# Patient Record
Sex: Female | Born: 1995 | Race: Black or African American | Hispanic: No | Marital: Single | Smoking: Current every day smoker
Health system: Southern US, Community
[De-identification: ages and names within clinical notes are randomized; demographics above are authoritative.]

## PROBLEM LIST (undated history)

## (undated) HISTORY — PX: TONSILLECTOMY: SUR1361

---

## 2013-04-13 ENCOUNTER — Emergency Department (HOSPITAL_BASED_OUTPATIENT_CLINIC_OR_DEPARTMENT_OTHER): Payer: Self-pay

## 2013-04-13 ENCOUNTER — Encounter (HOSPITAL_BASED_OUTPATIENT_CLINIC_OR_DEPARTMENT_OTHER): Payer: Self-pay | Admitting: Emergency Medicine

## 2013-04-13 ENCOUNTER — Emergency Department (HOSPITAL_BASED_OUTPATIENT_CLINIC_OR_DEPARTMENT_OTHER)
Admission: EM | Admit: 2013-04-13 | Discharge: 2013-04-13 | Disposition: A | Discharge status: Home / Self Care | Payer: Self-pay | Attending: Emergency Medicine | Admitting: Emergency Medicine

## 2013-04-13 DIAGNOSIS — F17200 Nicotine dependence, unspecified, uncomplicated: Secondary | ICD-10-CM | POA: Insufficient documentation

## 2013-04-13 DIAGNOSIS — R110 Nausea: Secondary | ICD-10-CM | POA: Insufficient documentation

## 2013-04-13 DIAGNOSIS — R1031 Right lower quadrant pain: Secondary | ICD-10-CM | POA: Insufficient documentation

## 2013-04-13 DIAGNOSIS — R109 Unspecified abdominal pain: Secondary | ICD-10-CM

## 2013-04-13 DIAGNOSIS — R1011 Right upper quadrant pain: Secondary | ICD-10-CM | POA: Insufficient documentation

## 2013-04-13 DIAGNOSIS — R1033 Periumbilical pain: Secondary | ICD-10-CM | POA: Insufficient documentation

## 2013-04-13 DIAGNOSIS — Z3202 Encounter for pregnancy test, result negative: Secondary | ICD-10-CM | POA: Insufficient documentation

## 2013-04-13 LAB — COMPREHENSIVE METABOLIC PANEL
ALK PHOS: 59 U/L (ref 39–117)
ALT: 9 U/L (ref 0–35)
AST: 13 U/L (ref 0–37)
Albumin: 4.2 g/dL (ref 3.5–5.2)
BUN: 9 mg/dL (ref 6–23)
CO2: 25 mEq/L (ref 19–32)
Calcium: 9.9 mg/dL (ref 8.4–10.5)
Chloride: 98 mEq/L (ref 96–112)
Creatinine, Ser: 0.7 mg/dL (ref 0.50–1.10)
GFR calc Af Amer: >90 mL/min (ref >90)
GFR calc non Af Amer: >90 mL/min (ref >90)
Glucose, Bld: 100 mg/dL — ABNORMAL HIGH (ref 70–99)
POTASSIUM: 4.3 meq/L (ref 3.7–5.3)
SODIUM: 138 meq/L (ref 137–147)
TOTAL PROTEIN: 7.7 g/dL (ref 6.0–8.3)
Total Bilirubin: 0.6 mg/dL (ref 0.3–1.2)

## 2013-04-13 LAB — URINALYSIS, ROUTINE W REFLEX MICROSCOPIC
Bilirubin Urine: NEGATIVE
Glucose, UA: NEGATIVE mg/dL
Hgb urine dipstick: NEGATIVE
Ketones, ur: 15 mg/dL — AB
LEUKOCYTES UA: NEGATIVE
Nitrite: NEGATIVE
PROTEIN: NEGATIVE mg/dL
Specific Gravity, Urine: 1.029 (ref 1.005–1.030)
UROBILINOGEN UA: 0.2 mg/dL (ref 0.0–1.0)
pH: 6 (ref 5.0–8.0)

## 2013-04-13 LAB — LIPASE, BLOOD: Lipase: 14 U/L (ref 11–59)

## 2013-04-13 LAB — CBC
HCT: 40.5 % (ref 36.0–46.0)
Hemoglobin: 13.9 g/dL (ref 12.0–15.0)
MCH: 31 pg (ref 26.0–34.0)
MCHC: 34.3 g/dL (ref 30.0–36.0)
MCV: 90.4 fL (ref 78.0–100.0)
Platelets: 395 10*3/uL (ref 150–400)
RBC: 4.48 MIL/uL (ref 3.87–5.11)
RDW: 11.7 % (ref 11.5–15.5)
WBC: 11.5 10*3/uL — ABNORMAL HIGH (ref 4.0–10.5)

## 2013-04-13 LAB — WET PREP, GENITAL
Clue Cells Wet Prep HPF POC: NONE SEEN
Trich, Wet Prep: NONE SEEN
WBC, Wet Prep HPF POC: NONE SEEN
Yeast Wet Prep HPF POC: NONE SEEN

## 2013-04-13 LAB — PREGNANCY, URINE: PREG TEST UR: NEGATIVE

## 2013-04-13 MED ORDER — MORPHINE SULFATE 4 MG/ML IJ SOLN
6.0000 mg | Freq: Once | INTRAMUSCULAR | Status: AC
  Administered 2013-04-13: 6 mg via INTRAVENOUS
  Filled 2013-04-13: qty 2

## 2013-04-13 MED ORDER — HYDROCODONE-ACETAMINOPHEN 5-325 MG PO TABS: 1.0000 | ORAL_TABLET | Freq: Four times a day (QID) | ORAL | Status: AC | PRN

## 2013-04-13 MED ORDER — IOHEXOL 300 MG/ML  SOLN
50.0000 mL | Freq: Once | INTRAMUSCULAR | Status: AC | PRN
  Administered 2013-04-13: 50 mL via ORAL

## 2013-04-13 MED ORDER — ONDANSETRON HCL 4 MG/2ML IJ SOLN
4.0000 mg | Freq: Once | INTRAMUSCULAR | Status: AC
  Administered 2013-04-13: 4 mg via INTRAVENOUS
  Filled 2013-04-13: qty 2

## 2013-04-13 MED ORDER — IOHEXOL 300 MG/ML  SOLN
100.0000 mL | Freq: Once | INTRAMUSCULAR | Status: AC | PRN
  Administered 2013-04-13: 100 mL via INTRAVENOUS

## 2013-04-13 NOTE — ED Notes (Signed)
Pt c/o diffuse abd pain with nausea x 3 days

## 2013-04-13 NOTE — ED Provider Notes (Signed)
CSN: [id number]     Arrival date & time 04/13/13  1840 History  This chart was scribed for Morgan HaitWilliam Troye Hiemstra, MD by Quintella ReichertMatthew Underwood, ED scribe.  This patient was seen in room MH07/MH07 and the patient's care was started at 7:52 PM.   Chief Complaint  Patient presents with  . Abdominal Pain    Patient is a 18 y.o. female presenting with abdominal pain. The history is provided by the patient. No language interpreter was used.  Abdominal Pain Pain quality: dull and throbbing   Pain severity:  Severe Duration:  2 days Timing:  Constant Chronicity:  New Context: not previous surgeries and not trauma   Relieved by:  Nothing Worsened by:  Movement Ineffective treatments:  OTC medications Associated symptoms: nausea   Associated symptoms: no chest pain, no diarrhea, no dysuria, no fever, no shortness of breath, no vaginal bleeding, no vaginal discharge and no vomiting   Risk factors: has not had multiple surgeries     HPI Comments: Morgan Barnett is a 18 y.o. female with no chronic medical conditions who presents to the Emergency Department complaining of 2 days of constant severe periumbilical abdominal pain with associated nausea.  Pt describes pain as intense, dull and throbbing.  It is localized mainly around the umbilicus but moves to the right side of her abdomen as well.  It is worsened by movement including when the car hit bumps on her ride here.  It is not relieved by anything.  She has attempted to treat pain with OTC medications, without relief.  Pt also complains of intermittent nausea but denies vomiting.  She denies fever, diarrhea, SOB, CP, vaginal bleeding or discharge, or dysuria.  She denies possibility of pregnancy.  She is a current smoker and occasionally drinks alcohol.   History reviewed. No pertinent past medical history.   Past Surgical History  Procedure Laterality Date  . Tonsillectomy      History reviewed. No pertinent family history.   History   Substance Use Topics  . Smoking status: Current Every Day Smoker -- 0.50 packs/day    Types: Cigarettes  . Smokeless tobacco: Not on file  . Alcohol Use: No    OB History   Grav Para Term Preterm Abortions TAB SAB Ect Mult Living                  Review of Systems  Constitutional: Negative for fever.  Respiratory: Negative for shortness of breath.   Cardiovascular: Negative for chest pain.  Gastrointestinal: Positive for nausea and abdominal pain. Negative for vomiting and diarrhea.  Genitourinary: Negative for dysuria, vaginal bleeding and vaginal discharge.  All other systems reviewed and are negative.     Allergies  Review of patient's allergies indicates no known allergies.  Home Medications  No current outpatient prescriptions on file.  BP 129/76  Pulse 94  Temp(Src) 98.7 F (37.1 C)  Resp 16  Ht 5\' 2"  (1.575 m)  Wt 220 lb (99.791 kg)  BMI 40.23 kg/m2  SpO2 100%  LMP 03/30/2013  Physical Exam  Nursing note and vitals reviewed. Constitutional: She is oriented to person, place, and time. She appears well-developed and well-nourished. No distress.  HENT:  Head: Normocephalic and atraumatic.  Eyes: EOM are normal.  Neck: Neck supple. No tracheal deviation present.  Cardiovascular: Normal rate.   Pulmonary/Chest: Effort normal. No respiratory distress.  Abdominal: There is tenderness (central, RUQ, and RLQ).  Genitourinary: There is no rash, tenderness or lesion on the right  labia. There is no rash, tenderness or lesion on the left labia. Right adnexum displays no mass, no tenderness and no fullness. Left adnexum displays no mass, no tenderness and no fullness.  Musculoskeletal: Normal range of motion.  Neurological: She is alert and oriented to person, place, and time.  Skin: Skin is warm and dry.  Psychiatric: She has a normal mood and affect. Her behavior is normal.    ED Course  Procedures (including critical care time)  DIAGNOSTIC STUDIES: Oxygen  Saturation is 100% on room air, normal by my interpretation.    COORDINATION OF CARE: 7:56 PM-Discussed treatment plan which includes pain medication, pelvic exam, CT abdomen, and labs with pt at bedside and pt agreed to plan.    Labs Review Labs Reviewed  URINALYSIS, ROUTINE W REFLEX MICROSCOPIC - Abnormal; Notable for the following:    Ketones, ur 15 (*)    All other components within normal limits  CBC - Abnormal; Notable for the following:    WBC 11.5 (*)    All other components within normal limits  COMPREHENSIVE METABOLIC PANEL - Abnormal; Notable for the following:    Glucose, Bld 100 (*)    All other components within normal limits  WET PREP, GENITAL  GC/CHLAMYDIA PROBE AMP  PREGNANCY, URINE  LIPASE, BLOOD    Imaging Review Ct Abdomen Pelvis W Contrast  04/13/2013   CLINICAL DATA:  Mid and lower abdominal pain and nausea.  EXAM: CT ABDOMEN AND PELVIS WITH CONTRAST  TECHNIQUE: Multidetector CT imaging of the abdomen and pelvis was performed using the standard protocol following bolus administration of intravenous contrast.  CONTRAST:  50mL OMNIPAQUE IOHEXOL 300 MG/ML SOLN, OMNIPAQUE IOHEXOL 300 MG/ML SOLN  COMPARISON:  None.  FINDINGS: The liver, biliary tree, spleen, pancreas, adrenal glands, and kidneys are normal. The bowel is normal including the terminal ileum and appendix. Uterus and ovaries are normal. Bladder is normal. No osseous abnormality. No free air or free fluid. Lung bases are clear.  IMPRESSION: Benign appearing abdomen and pelvis.   Electronically Signed   By: Geanie Cooley M.D.   On: 04/13/2013 21:28    EKG Interpretation   None       MDM   1. Abdominal pain    66F presents with diffuse abdominal pain for past few days. Mild nausea, no vomiting, no diarrhea. No dysuria, hematuria, vaginal discharge. Normal BMs. No CP or SOB. Here with stable vitals. Abd pain periumbilical, RLQ, RUQ. Patient's pelvic with mild discharge, but no tenderness, no  cervicitis. CT normal. Patient stable for discharge. Given small amount of vicodin for pain and resource guide for f/u.     I personally performed the services described in this documentation, which was scribed in my presence. The recorded information has been reviewed and is accurate.     Morgan Hait, MD 04/14/13 [phone]

## 2013-04-13 NOTE — ED Notes (Signed)
MD at bedside. 

## 2013-04-13 NOTE — Discharge Instructions (Signed)
Abdominal Pain, Women °Abdominal (stomach, pelvic, or belly) pain can be caused by many things. It is important to tell your doctor: °· The location of the pain. °· Does it come and go or is it present all the time? °· Are there things that start the pain (eating certain foods, exercise)? °· Are there other symptoms associated with the pain (fever, nausea, vomiting, diarrhea)? °All of this is helpful to know when trying to find the cause of the pain. °CAUSES  °· Stomach: virus or bacteria infection, or ulcer. °· Intestine: appendicitis (inflamed appendix), regional ileitis (Crohn's disease), ulcerative colitis (inflamed colon), irritable bowel syndrome, diverticulitis (inflamed diverticulum of the colon), or cancer of the stomach or intestine. °· Gallbladder disease or stones in the gallbladder. °· Kidney disease, kidney stones, or infection. °· Pancreas infection or cancer. °· Fibromyalgia (pain disorder). °· Diseases of the female organs: °· Uterus: fibroid (non-cancerous) tumors or infection. °· Fallopian tubes: infection or tubal pregnancy. °· Ovary: cysts or tumors. °· Pelvic adhesions (scar tissue). °· Endometriosis (uterus lining tissue growing in the pelvis and on the pelvic organs). °· Pelvic congestion syndrome (female organs filling up with blood just before the menstrual period). °· Pain with the menstrual period. °· Pain with ovulation (producing an egg). °· Pain with an IUD (intrauterine device, birth control) in the uterus. °· Cancer of the female organs. °· Functional pain (pain not caused by a disease, may improve without treatment). °· Psychological pain. °· Depression. °DIAGNOSIS  °Your doctor will decide the seriousness of your pain by doing an examination. °· Blood tests. °· X-rays. °· Ultrasound. °· CT scan (computed tomography, special type of X-ray). °· MRI (magnetic resonance imaging). °· Cultures, for infection. °· Barium enema (dye inserted in the large intestine, to better view it with  X-rays). °· Colonoscopy (looking in intestine with a lighted tube). °· Laparoscopy (minor surgery, looking in abdomen with a lighted tube). °· Major abdominal exploratory surgery (looking in abdomen with a large incision). °TREATMENT  °The treatment will depend on the cause of the pain.  °· Many cases can be observed and treated at home. °· Over-the-counter medicines recommended by your caregiver. °· Prescription medicine. °· Antibiotics, for infection. °· Birth control pills, for painful periods or for ovulation pain. °· Hormone treatment, for endometriosis. °· Nerve blocking injections. °· Physical therapy. °· Antidepressants. °· Counseling with a psychologist or psychiatrist. °· Minor or major surgery. °HOME CARE INSTRUCTIONS  °· Do not take laxatives, unless directed by your caregiver. °· Take over-the-counter pain medicine only if ordered by your caregiver. Do not take aspirin because it can cause an upset stomach or bleeding. °· Try a clear liquid diet (broth or water) as ordered by your caregiver. Slowly move to a bland diet, as tolerated, if the pain is related to the stomach or intestine. °· Have a thermometer and take your temperature several times a day, and record it. °· Bed rest and sleep, if it helps the pain. °· Avoid sexual intercourse, if it causes pain. °· Avoid stressful situations. °· Keep your follow-up appointments and tests, as your caregiver orders. °· If the pain does not go away with medicine or surgery, you may try: °· Acupuncture. °· Relaxation exercises (yoga, meditation). °· Group therapy. °· Counseling. °SEEK MEDICAL CARE IF:  °· You notice certain foods cause stomach pain. °· Your home care treatment is not helping your pain. °· You need stronger pain medicine. °· You want your IUD removed. °· You feel faint or   lightheaded. °· You develop nausea and vomiting. °· You develop a rash. °· You are having side effects or an allergy to your medicine. °SEEK IMMEDIATE MEDICAL CARE IF:  °· Your  pain does not go away or gets worse. °· You have a fever. °· Your pain is felt only in portions of the abdomen. The right side could possibly be appendicitis. The left lower portion of the abdomen could be colitis or diverticulitis. °· You are passing blood in your stools (bright red or black tarry stools, with or without vomiting). °· You have blood in your urine. °· You develop chills, with or without a fever. °· You pass out. °MAKE SURE YOU:  °· Understand these instructions. °· Will watch your condition. °· Will get help right away if you are not doing well or get worse. °Document Released: 01/05/2007 Document Revised: 06/02/2011 Document Reviewed: 01/25/2009 °ExitCare® Patient Information ©2014 [employer]. ° °Emergency Department Resource Guide °1) Find a Doctor and Pay Out of Pocket °Although you won't have to find out who is covered by your insurance plan, it is a good idea to ask around and get recommendations. You will then need to call the office and see if the doctor you have chosen will accept you as a new patient and what types of options they offer for patients who are self-pay. Some doctors offer discounts or will set up payment plans for their patients who do not have insurance, but you will need to ask so you aren't surprised when you get to your appointment. ° °2) Contact Your Local Health Department °Not all health departments have doctors that can see patients for sick visits, but many do, so it is worth a call to see if yours does. If you don't know where your local health department is, you can check in your phone book. The CDC also has a tool to help you locate your state's health department, and many state websites also have listings of all of their local health departments. ° °3) Find a Walk-in Clinic °If your illness is not likely to be very severe or complicated, you may want to try a walk in clinic. These are popping up all over the country in pharmacies, drugstores, and shopping  centers. They're usually staffed by nurse practitioners or physician assistants that have been trained to treat common illnesses and complaints. They're usually fairly quick and inexpensive. However, if you have serious medical issues or chronic medical problems, these are probably not your best option. ° °No Primary Care Doctor: °- Call [facility] at  832-8000 - they can help you locate a primary care doctor that  accepts your insurance, provides certain services, etc. °- Physician Referral Service- 1-800-533-3463 ° °Chronic Pain Problems: °Organization         Address  Phone   Notes  °[facility]  (336) 297-2271 Patients need to be referred by their primary care doctor.  ° °Medication Assistance: °Organization         Address  Phone   Notes  °[place] [facility] [address] °[place], [place] [address] (336) 641-8030 --Must be a resident of [place] °-- Must have NO insurance coverage whatsoever (no Medicaid/ Medicare, etc.) °-- The pt. MUST have a primary care doctor that directs their care regularly and follows them in the community °  °[facility]  (866) 331-1348   °[employer]  (888) 892-1162   ° °Agencies that provide inexpensive medical care: °Organization           Address  Phone   Notes  °[facility]  (336) 832-8035   °[facility]    (336) 832-7272   °[facility] [address] °[place], [place] [address] (336) 832-4777   °[facility] [address], °[place] (336) 271-4999   °[facility]    (336) 373-0678   °[facility]    (336) 272-1050   °[facility] ° [address], [place] Phone:  (336) 832-4444, Fax:  (336) 832-4440 Hours of Operation:  9 am - 6 pm, M-F.  Also accepts Medicaid/Medicare and self-pay.  °[facility] ° [address], [place] Phone: (336) 832-3150, Fax: (336)  832-3151. Hours of Operation:  8:30 am - 5:30 pm, M-F.  Also accepts Medicaid and self-pay.  °[facility] [place] [address], [place] Phone: (336) 878-6027   °[facility] [address], [place], [place] (336)723-1848, Ext. 123 Mondays & Thursdays: 7-9 AM.  First 15 patients are seen on a first come, first serve basis. °  ° °Medicaid-accepting [place] Providers: ° °Organization         Address  Phone   Notes  °[facility] [address], [place] (336) 641-2100 Also accepts self-pay patients.  °[facility] [address], [place] ° (336) 856-9996   °[facility] [address], [place] (336) 288-8857   °[facility] [address] [place] [address], [place] (336) 299-7000   °Veita Bland [address], [place]  ° (336) 373-1557 Only accepts [place] Access Medicaid patients after they have their name applied to their card.  ° °Self-Pay (no insurance) in [place]: ° °Organization         Address  Phone   Notes  °Sickle Cell Patients, [facility] [address], [place] (336) 832-1970   °[facility] [address], [place] (336) 832-4400   °[facility] ° [address], [place] (336) 992-4800   °[facility]/Dr. Osei-Bonsu ° [address] [place] [address], [place] or [address], [place] (336) [phone] Phone number for both [place] and [place] locations is the same.  °[facility] [address], [place] (336) 299-0000   °[facility] [address] [place] [address], [place] or [address] (336) 852-7530 °(336) 878-2260   °[facility] [address], [place] (336) 350-1642, phone; (336) 294-5005, fax Sees patients 1st and 3rd Saturday of every month.  Must not qualify for public or private insurance (i.e.  Medicaid, Medicare, [place] Health Choice, Veterans' Benefits) • Household income should be no more than 200% of the poverty level •The clinic cannot treat you if you are pregnant or think you are pregnant • Sexually transmitted diseases are not treated at the clinic.  ° ° °Dental Care: °Organization         Address  Phone  Notes  °[facility] [address], [place] (336) 641-6152 Accepts children up to age 21 who are enrolled in Medicaid or [place] Health Choice; pregnant women with a Medicaid card; and children who have applied for Medicaid or  Health Choice, but were declined, whose parents can pay a reduced fee at time   of service.  °[facility] [place]  [address], [place] (336) 641-7733 Accepts children up to age 21 who are enrolled in Medicaid or [place] Health Choice; pregnant women with a Medicaid card; and children who have applied for Medicaid or [place] Health Choice, but were declined, whose parents can pay a reduced fee at time of service.  °[facility] ° [address], [place] (336) 641-4533 Patients are seen by appointment only. Walk-ins are not accepted. [facility] will see patients 18 years of age and older. °Monday - Tuesday (8am-5pm) °Most Wednesdays (8:30-5pm) °$30 per visit, cash only  °[facility] ° [address], [place] (336) 641-4533 Patients are seen by appointment only. Walk-ins are not accepted. [facility] will see patients 18 years of age and older. °One Wednesday Evening (Monthly: Volunteer Based).  $30 per visit, cash only  °[facility]  (919) 537-3737 for adults; Children under age 4, call Graduate Pediatric Dentistry at (919) [phone]. Children aged 4-14, please call (919) [phone] to request a pediatric application. ° Dental services are provided in all areas of dental care including fillings,  crowns and bridges, complete and partial dentures, implants, gum treatment, root canals, and extractions. Preventive care is also provided. Treatment is provided to both adults and children. °Patients are selected via a lottery and there is often a waiting list. °  °[facility] [address], °[place] ° (336) 763-8833 [email or web address] °  °[facility] [address], [place], [place] (336)723-1848, Ext. 123 Second and Fourth Thursday of each month, opens at 6:30 AM; Clinic ends at 9 AM.  Patients are seen on a first-come first-served basis, and a limited number are seen during each clinic.  ° °[facility] ° [address], [place], [place] (336) 723-7904   Eligibility Requirements °You must have lived in [place], [place], or [place] counties for at least the last three months. °  You cannot be eligible for state or federal sponsored healthcare insurance, including Veterans Administration, Medicaid, or Medicare. °  You generally cannot be eligible for healthcare insurance through your employer.  °  How to apply: °Eligibility screenings are held every Tuesday and Wednesday afternoon from 1:00 pm until 4:00 pm. You do not need an appointment for the interview!  °[facility] [address], [place], [place] 336-631-2330   °[facility]  336-342-8273   °[facility]  336-703-3100   °[facility]  336-570-6415   ° °Behavioral Health Resources in the Community: °Intensive Outpatient Programs °Organization         Address  Phone  Notes  °[facility] [address], [place], [place] 336-878-6098   °[facility] [address], [place], [place] 336-832-9800   °[facility] [address], [place], [place] ° 336-882-2125   °[facility] [address],  °[place], [place] 1-800-853-5163 or 336-641-4981   °Substance Abuse  Resources °Organization         Address  Phone  Notes  °[facility]  336-882-2125   °[facility]  336-784-9470   °[facility] [place] [facility]  336-285-9073   °[facility]  336-845-3988   °Residential & Outpatient Substance Abuse Program  1-800-659-3381   °Psychological Services °Organization         Address    Phone  Notes  °[facility]  336- 832-9600   °[facility]  336- 378-7881   °[facility] [address], [place] 1-800-853-5163 or 336-641-4981   ° °Mobile Crisis Teams °Organization         Address  Phone  Notes  °[facility], Mobile Crisis Care Unit  1-877-626-1772   °Assertive °[facility] ° [address] [place], [place] 336-834-9664   °Sharon DeEsch [address] °[place] 336-554-5454   ° °Self-Help/Support Groups °Organization         Address  Phone             Notes  °[facility] - variety of support groups  336- 373-1402 Call for more information  °Narcotics Anonymous (NA), [facility] [address], °[place]  2 meetings at this location  ° °Residential Treatment Programs °Organization         Address  Phone  Notes  °[facility] [address],    °[place]  1-866-801-8205   °[facility] ° [address], [place], [place] 704-293-8524   °[facility] [address], [place] 336-845-3988 Admissions: 8am-3pm M-F  °[facility] [address]    °[place], [place] 336-841-1104   °[facility] [address], [place], [place] 336-379-7146   °[facility] [place] [facility] [address]  °[place], [place] 336-285-9073   °[facility] Intensive Outpatient [address], [place], [place] 336-852-3033   °[facility] [address]  °[place], [place] 1-877-615-2722 or 336-784-9470   °[facility] [address] [place], [place] 336-227-7417 Accepts Medicaid  °[facility] [address]  ° [place] 1-800-659-3381 Substance Abuse/Addiction Treatment  ° °[place] Behavioral Health Resources °Organization         Address  Phone  Notes  °[facility]  (888) 581-9988   °Julie Brannon, PhD [address] [place], [place]   (336) 349-5553 or (336) 951-0000   °[facility]   [address] °[place], [place] (336) 349-4454   °[facility] [address], [place], [place] (336) 342-8316 Insurance/Medicaid/sponsorship through [facility]  °[facility] [address]                                    [place], [place] (336) 342-8316 Therapy/tele-psych/case  °[facility] [address]  ° , [place] (336) 349-2233    °Dr. Arfeen  (336) 349-4544   °[facility] [place]  [employer] [place] [facility] 1) [address],  °2) [address], [place] °3)  [address], [place] (336) 349-3220 °(336) 342-7768 ° °(336) 342-8140   °[place] [facility] (336) 342-1394 or (336) 342-3537 (After Hours)    ° ° ° °

## 2013-04-13 NOTE — ED Notes (Signed)
Patient transported to CT 

## 2013-04-13 NOTE — ED Notes (Signed)
MD at bedside discussing test results and Dispo plan of care.

## 2013-04-14 LAB — GC/CHLAMYDIA PROBE AMP
CT Probe RNA: NEGATIVE
GC PROBE AMP APTIMA: NEGATIVE

## 2014-07-09 IMAGING — CT CT ABD-PELV W/ CM
2 of 4 series · 17 of 46 positions shown, 19 images · IV contrast (APPLIED)
Comparison: None.

CLINICAL DATA: Mid and lower abdominal pain and nausea.

EXAM:
CT ABDOMEN AND PELVIS WITH CONTRAST
TECHNIQUE: Multidetector CT imaging of the abdomen and pelvis was performed
using the standard protocol following bolus administration of
intravenous contrast.
CONTRAST:  50mL OMNIPAQUE IOHEXOL 300 MG/ML SOLN, 100mL OMNIPAQUE
IOHEXOL 300 MG/ML SOLN

[Series 2: abd/pelvis 5.0 b31f · axial · 0.85mm/px · z∈[-440,-25]mm · 14 of 91 slices shown, 16 images]
[im 4/91  soft-tissue]
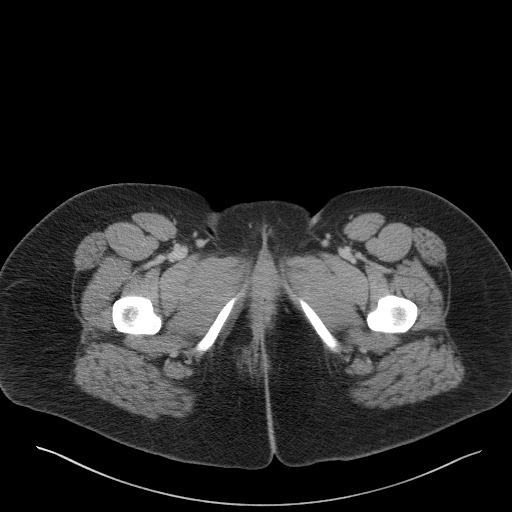
[im 4/91  bone]
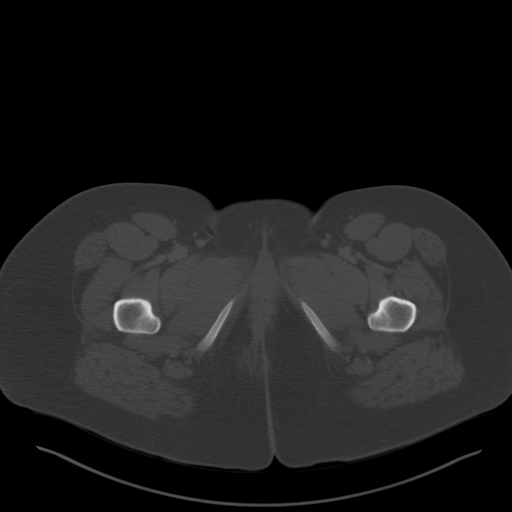
[im 12/91  soft-tissue]
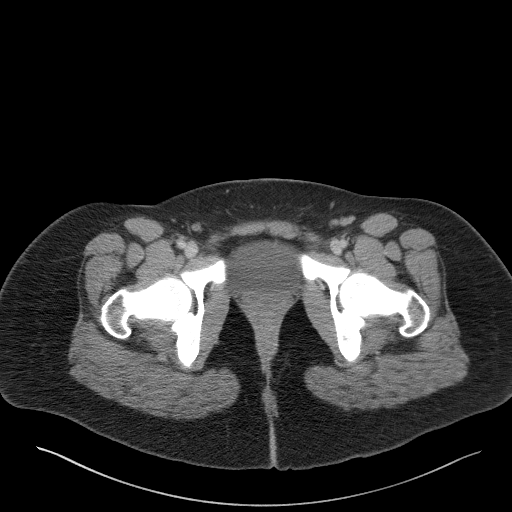
[im 16/91  soft-tissue]
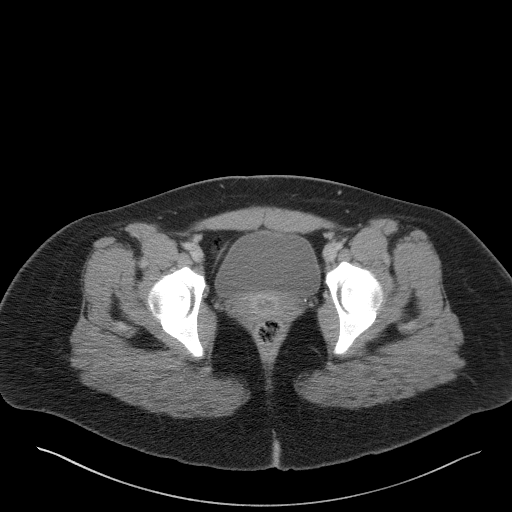
[im 24/91  soft-tissue]
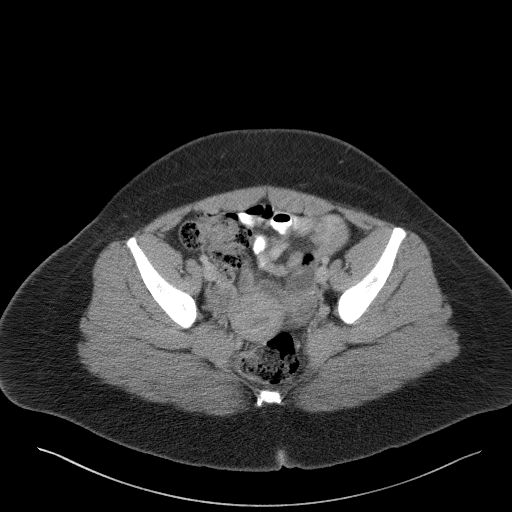
[im 32/91  soft-tissue]
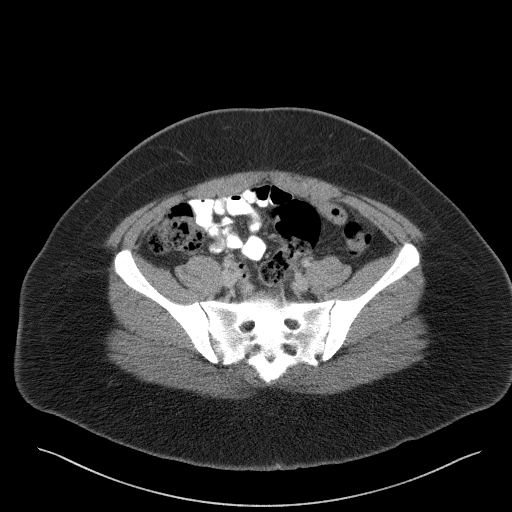
[im 36/91  soft-tissue]
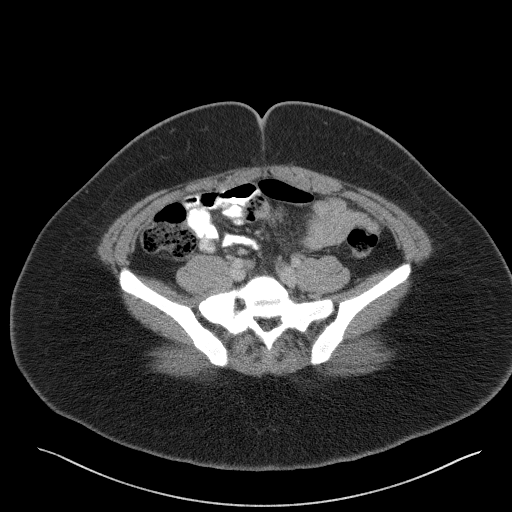
[im 44/91  soft-tissue]
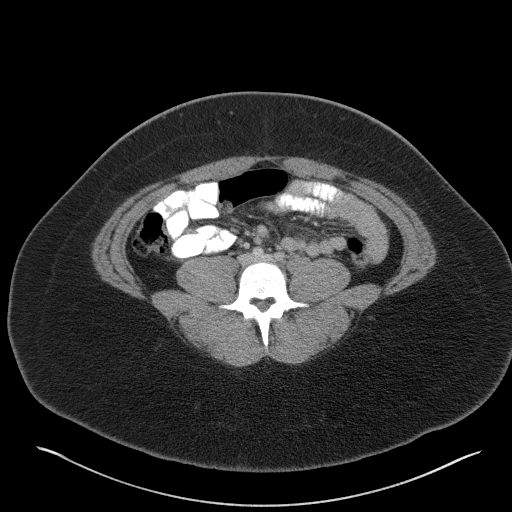
[im 47/91  soft-tissue]
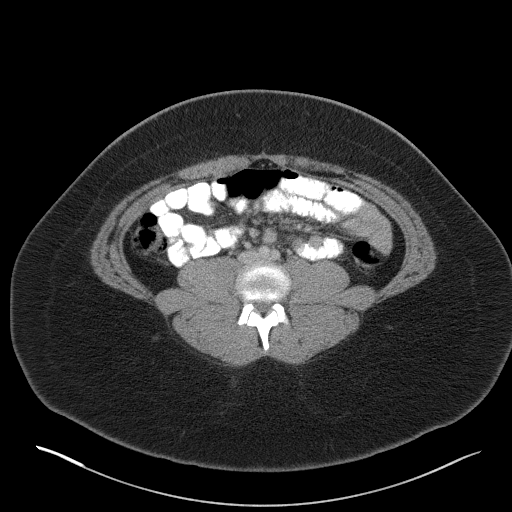
[im 55/91  soft-tissue]
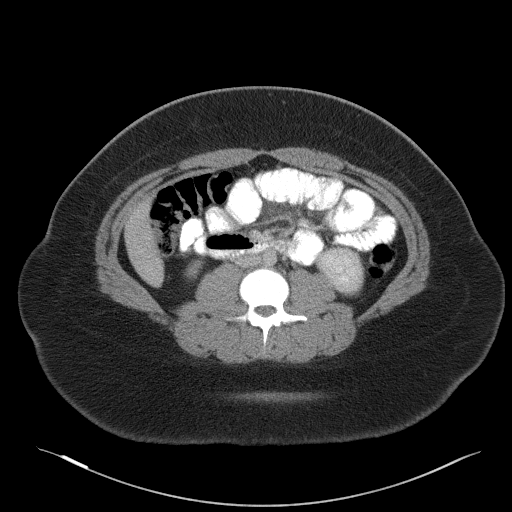
[im 55/91  bone]
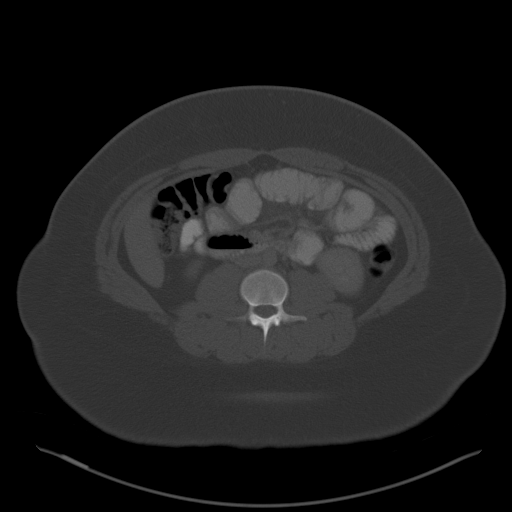
[im 59/91  soft-tissue]
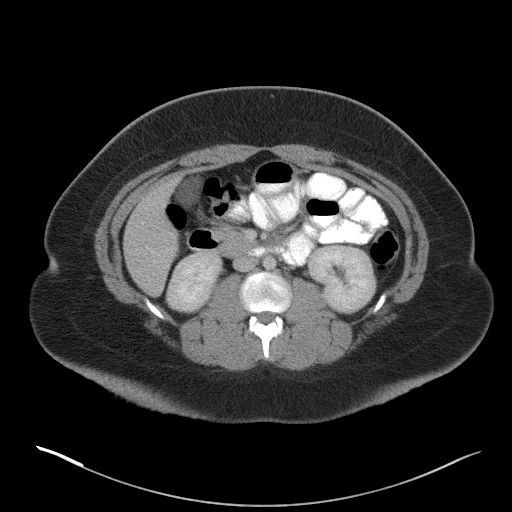
[im 67/91  soft-tissue]
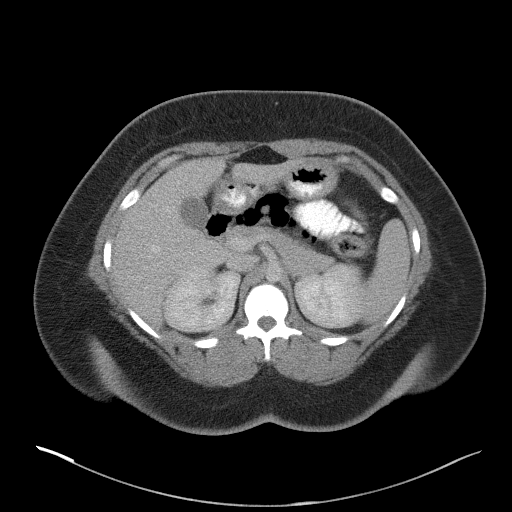
[im 75/91  soft-tissue]
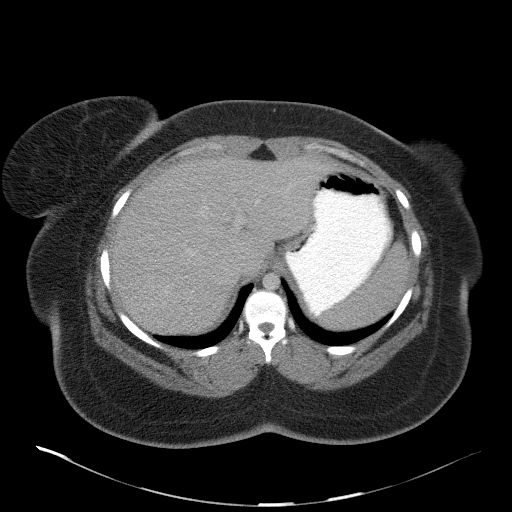
[im 79/91  soft-tissue]
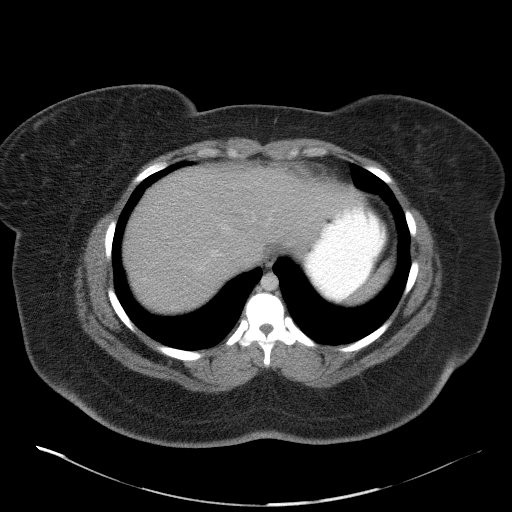
[im 87/91  soft-tissue]
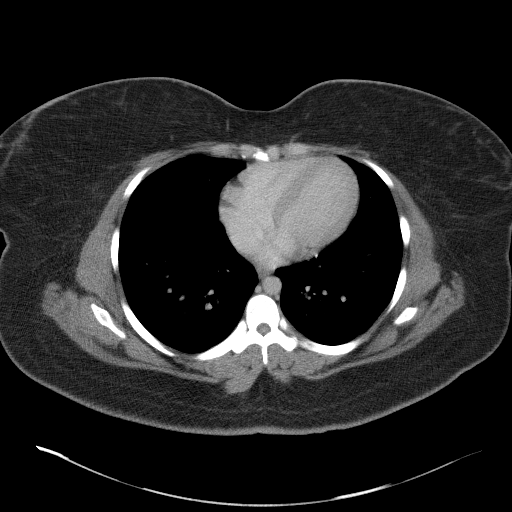

[Series 5: abd/pelvis 3.0 coronal · coronal · 0.88mm/px · 3 of 97 slices shown]
[im 33/97  soft-tissue]
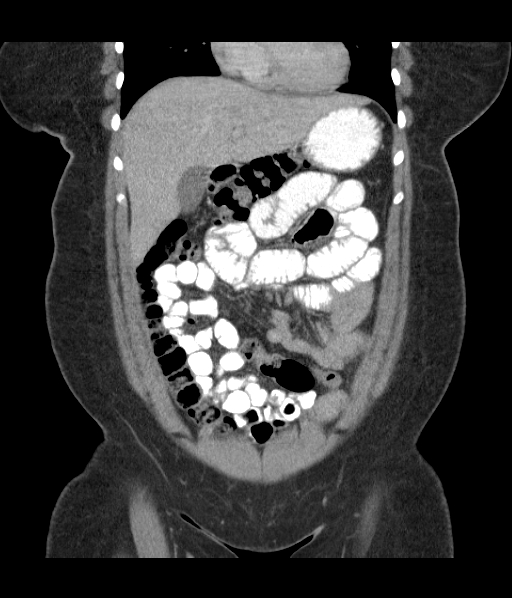
[im 43/97  soft-tissue]
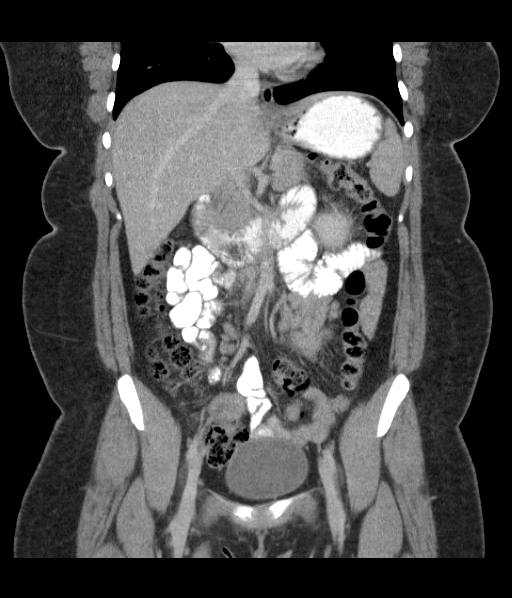
[im 54/97  soft-tissue]
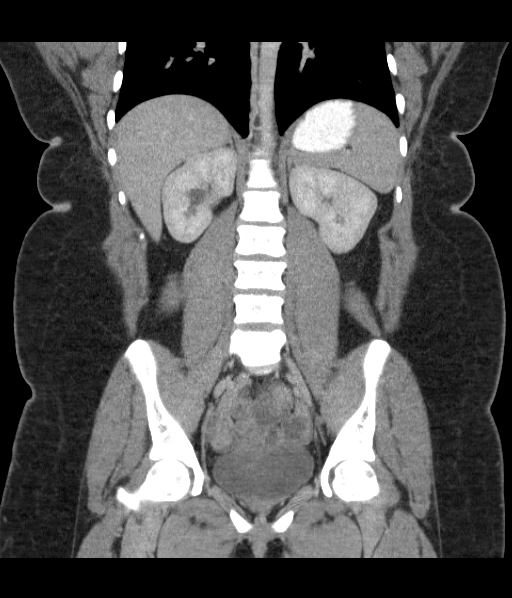

[17 of 46 positions shown; findings below may reference images not displayed]

FINDINGS: The liver, biliary tree, spleen, pancreas, adrenal glands, and
kidneys are normal. The bowel is normal including the terminal ileum
and appendix. Uterus and ovaries are normal. Bladder is normal. No
osseous abnormality. No free air or free fluid. Lung bases are
clear.
IMPRESSION: Benign appearing abdomen and pelvis.

## 2021-07-18 ENCOUNTER — Other Ambulatory Visit: Payer: Self-pay

## 2021-07-18 ENCOUNTER — Encounter (HOSPITAL_BASED_OUTPATIENT_CLINIC_OR_DEPARTMENT_OTHER): Payer: Self-pay | Admitting: Emergency Medicine

## 2021-07-18 ENCOUNTER — Emergency Department (HOSPITAL_BASED_OUTPATIENT_CLINIC_OR_DEPARTMENT_OTHER)
Admission: EM | Admit: 2021-07-18 | Discharge: 2021-07-18 | Disposition: A | Discharge status: Home / Self Care | Payer: Medicaid Other | Attending: Emergency Medicine | Admitting: Emergency Medicine

## 2021-07-18 DIAGNOSIS — Z20822 Contact with and (suspected) exposure to covid-19: Secondary | ICD-10-CM | POA: Insufficient documentation

## 2021-07-18 DIAGNOSIS — J029 Acute pharyngitis, unspecified: Secondary | ICD-10-CM | POA: Insufficient documentation

## 2021-07-18 LAB — RESP PANEL BY RT-PCR (FLU A&B, COVID) ARPGX2
Influenza A by PCR: NEGATIVE
Influenza B by PCR: NEGATIVE
SARS Coronavirus 2 by RT PCR: NEGATIVE

## 2021-07-18 LAB — GROUP A STREP BY PCR: Group A Strep by PCR: NOT DETECTED

## 2021-07-18 MED ORDER — DEXAMETHASONE 10 MG/ML FOR PEDIATRIC ORAL USE
10.0000 mg | Freq: Once | INTRAMUSCULAR | Status: AC
  Administered 2021-07-18: 10 mg via ORAL
  Filled 2021-07-18: qty 1

## 2021-07-18 MED ORDER — KETOROLAC TROMETHAMINE 30 MG/ML IJ SOLN
30.0000 mg | Freq: Once | INTRAMUSCULAR | Status: AC
Start: 2021-07-18 — End: 2021-07-18
  Administered 2021-07-18: 30 mg via INTRAMUSCULAR
  Filled 2021-07-18: qty 1

## 2021-07-18 MED ORDER — PENICILLIN G BENZATHINE 1200000 UNIT/2ML IM SUSY
1.2000 10*6.[IU] | PREFILLED_SYRINGE | Freq: Once | INTRAMUSCULAR | Status: AC
Start: 2021-07-18 — End: 2021-07-18
  Administered 2021-07-18: 1.2 10*6.[IU] via INTRAMUSCULAR
  Filled 2021-07-18: qty 2

## 2021-07-18 NOTE — ED Provider Notes (Signed)
?[facility] EMERGENCY DEPARTMENT ?Provider Note ? ? ?CSN: [id number] ?Arrival date & time: 07/18/21  1601 ? ?  ? ?History ? ?Chief Complaint  ?Patient presents with  ? Sore Throat  ? ? ?Morgan Barnett is a 26 y.o. female. ? ?Patient is a 26 yo female presenting for sore throat. Pt admits to sore throat, dry cough, and fever with Tmax of 102 F orally that developed 2 weeks ago. States fever and cough ressolved but sore throat is still present. States sore throat pain is gradually worsening to the point where it is painful to drink water. No current fevers or chills.  ? ?The history is provided by the patient. No language interpreter was used.  ?Sore Throat ?Pertinent negatives include no chest pain, no abdominal pain and no shortness of breath.  ? ?  ? ?Home Medications ?Prior to Admission medications   ?Medication Sig Start Date End Date Taking? Authorizing Provider  ?HYDROcodone-acetaminophen (NORCO/VICODIN) 5-325 MG per tablet Take 1 tablet by mouth every 6 (six) hours as needed for moderate pain. 04/13/13   Elwin Mocha, MD  ?   ? ?Allergies    ?Patient has no known allergies.   ? ?Review of Systems   ?Review of Systems  ?Constitutional:  Negative for chills and fever.  ?HENT:  Positive for congestion and sore throat. Negative for ear pain.   ?Eyes:  Negative for pain and visual disturbance.  ?Respiratory:  Negative for cough and shortness of breath.   ?Cardiovascular:  Negative for chest pain and palpitations.  ?Gastrointestinal:  Negative for abdominal pain and vomiting.  ?Genitourinary:  Negative for dysuria and hematuria.  ?Musculoskeletal:  Negative for arthralgias and back pain.  ?Skin:  Negative for color change and rash.  ?Neurological:  Negative for seizures and syncope.  ?All other systems reviewed and are negative. ? ?Physical Exam ?Updated Vital Signs ?BP 129/61   Pulse (!) 107   Temp 98.3 ?F (36.8 ?C) (Oral)   Resp 20   Ht 5\' 2"  (1.575 m)   Wt 122.5 kg   LMP 07/16/2021   SpO2 100%    BMI 49.38 kg/m?  ?Physical Exam ?Vitals and nursing note reviewed.  ?Constitutional:   ?   General: She is not in acute distress. ?   Appearance: She is well-developed.  ?HENT:  ?   Head: Normocephalic and atraumatic.  ?   Mouth/Throat:  ?   Lips: Pink.  ?   Mouth: Mucous membranes are moist. No oral lesions or angioedema.  ?   Dentition: Normal dentition.  ?   Tongue: No lesions. Tongue does not deviate from midline.  ?   Palate: No mass and lesions.  ?   Pharynx: Uvula midline. Pharyngeal swelling and posterior oropharyngeal erythema present. No oropharyngeal exudate or uvula swelling.  ?   Comments: No tonsils  ?No drooling ? ?Eyes:  ?   Conjunctiva/sclera: Conjunctivae normal.  ?Cardiovascular:  ?   Rate and Rhythm: Normal rate and regular rhythm.  ?   Heart sounds: No murmur heard. ?Pulmonary:  ?   Effort: Pulmonary effort is normal. No respiratory distress.  ?   Breath sounds: Normal breath sounds.  ?Musculoskeletal:     ?   General: No swelling.  ?   Cervical back: Neck supple.  ?Skin: ?   General: Skin is warm and dry.  ?Neurological:  ?   Mental Status: She is alert.  ? ? ?ED Results / Procedures / Treatments   ?Labs ?(all labs ordered  are listed, but only abnormal results are displayed) ?Labs Reviewed  ?GROUP A STREP BY PCR  ?RESP PANEL BY RT-PCR (FLU A&B, COVID) [id number]  ? ? ?EKG ?None ? ?Radiology ?No results found. ? ?Procedures ?Procedures  ? ? ?Medications Ordered in ED ?Medications  ?penicillin g benzathine (BICILLIN LA) 1200000 UNIT/2ML injection 1.2 Million Units (has no administration in time range)  ?dexamethasone (DECADRON) 10 MG/ML injection for Pediatric ORAL use 10 mg (10 mg Oral Given 07/18/21 0811)  ?ketorolac (TORADOL) 30 MG/ML injection 30 mg (30 mg Intramuscular Given 07/18/21 0812)  ? ? ?ED Course/ Medical Decision Making/ A&P ?  ?                        ?Medical Decision Making ?Risk ?Prescription drug management. ? ? ?26 year old female presenting for sore throat x2 weeks.  Serious  etiologies considered including Ludwig angina, peritonsillar abscess, epiglottitis or deep neck space infection. ? ?Patient is alert and oriented x3, no acute distress, afebrile, stable vital signs.  Oropharynx demonstrates swelling and erythema.  Strep test negative.  Sore throat likely secondary to pharyngitis.  Low concern for any life-threatening etiologies, as discussed above, of sore throat.  Decadron, Toradol, and penicillin given. ? ?Patient in no distress and overall condition improved here in the ED. Detailed discussions were had with the patient regarding current findings, and need for close f/u with PCP or on call doctor. The patient has been instructed to return immediately if the symptoms worsen in any way for re-evaluation. Patient verbalized understanding and is in agreement with current care plan. All questions answered prior to discharge. ? ?Of note, patient admits to snoring while sleeping.  Due to patient's body habitus, obstructive sleep apnea likely.  Patient given follow-up recommendations with pulmonology for evaluation and sleep study. ? ? ? ? ? ? ? ?Final Clinical Impression(s) / ED Diagnoses ?Final diagnoses:  ?Pharyngitis, unspecified etiology  ? ? ?Rx / DC Orders ?ED Discharge Orders   ? ? None  ? ?  ? ? ?  ?Franne Forts, DO ?07/18/21 [id number] ? ?

## 2021-07-18 NOTE — ED Triage Notes (Signed)
Patient presents to ED via POV from home. Here with sore throat x3 weeks. Ambulatory. Well appearing.  ?
